# Patient Record
Sex: Female | Born: 1995 | Race: Black or African American | Hispanic: No | Marital: Single | Smoking: Never smoker
Health system: Southern US, Community
[De-identification: ages and names within clinical notes are randomized; demographics above are authoritative.]

## PROBLEM LIST (undated history)

## (undated) HISTORY — PX: FRACTURE SURGERY: SHX138

---

## 2013-04-12 ENCOUNTER — Emergency Department (HOSPITAL_COMMUNITY)
Admission: EM | Admit: 2013-04-12 | Discharge: 2013-04-12 | Disposition: A | Discharge status: Home / Self Care | Attending: Emergency Medicine | Admitting: Emergency Medicine

## 2013-04-12 ENCOUNTER — Encounter (HOSPITAL_COMMUNITY): Payer: Self-pay | Admitting: Emergency Medicine

## 2013-04-12 DIAGNOSIS — M542 Cervicalgia: Secondary | ICD-10-CM | POA: Insufficient documentation

## 2013-04-12 DIAGNOSIS — Z79899 Other long term (current) drug therapy: Secondary | ICD-10-CM | POA: Insufficient documentation

## 2013-04-12 DIAGNOSIS — R079 Chest pain, unspecified: Secondary | ICD-10-CM | POA: Insufficient documentation

## 2013-04-12 DIAGNOSIS — J029 Acute pharyngitis, unspecified: Secondary | ICD-10-CM

## 2013-04-12 DIAGNOSIS — R51 Headache: Secondary | ICD-10-CM | POA: Insufficient documentation

## 2013-04-12 MED ORDER — HYDROCODONE-HOMATROPINE 5-1.5 MG/5ML PO SYRP: 5.0000 mL | ORAL_SOLUTION | Freq: Four times a day (QID) | ORAL | Status: DC | PRN

## 2013-04-12 NOTE — ED Provider Notes (Signed)
CSN: [id number]     Arrival date & time 04/12/13  1053 History  This chart was scribed for non-physician practitioner Fayrene Helper, PA-C working with Laray Anger, DO by Leone Payor, ED Scribe. This patient was seen in room TR05C/TR05C and the patient's care was started at 1053.    Chief Complaint  Patient presents with  . Sore Throat    The history is provided by the patient. No language interpreter was used.    HPI Comments: Tammy Sawyer is a 17 y.o. female who presents to the Emergency Department complaining of 1 day of gradual onset, gradually worsening, constant sore throat and anterior neck pain. Pt reports having a mild HA yesterday and intermittent, sharp chest pain that began today. Pt states her throat pain is worse with swallowing. She has tried to use Catering manager without relief. She denies history of recurrent strep throat. She denies rhinorrhea, sneezing, coughing, fever, chills, SOB, abdominal pain, nausea, vomiting, diarrhea, rash.    History reviewed. No pertinent past medical history. History reviewed. No pertinent past surgical history. No family history on file. History  Substance Use Topics  . Smoking status: Never Smoker   . Smokeless tobacco: Not on file  . Alcohol Use: No   OB History   Grav Para Term Preterm Abortions TAB SAB Ect Mult Living                 Review of Systems  Constitutional: Negative for fever and chills.  HENT: Positive for sore throat. Negative for rhinorrhea and sneezing.   Eyes: Negative for photophobia.  Respiratory: Negative for cough and shortness of breath.   Cardiovascular: Positive for chest pain.  Gastrointestinal: Negative for nausea, vomiting, abdominal pain and diarrhea.  Skin: Negative for rash.  Neurological: Positive for headaches.    Allergies  Review of patient's allergies indicates no known allergies.  Home Medications   Current Outpatient Rx  Name  Route  Sig  Dispense  Refill  . Ascorbic Acid (VITAMIN  C GUMMIE PO)   Oral   Take 2 tablets by mouth daily.         Marland Kitchen Phenyleph-CPM-DM-Aspirin (ALKA-SELTZER PLUS COLD & COUGH) 7.12-26-08-325 MG TBEF   Oral   Take 2 tablets by mouth once.          BP 119/78  Pulse 101  Temp(Src) 97 F (36.1 C) (Oral)  SpO2 98%  LMP 04/02/2013 Physical Exam  Nursing note and vitals reviewed. Constitutional: She is oriented to person, place, and time. She appears well-developed and well-nourished.  Does not appear toxic.   HENT:  Head: Normocephalic and atraumatic.  Right Ear: Tympanic membrane, external ear and ear canal normal.  Left Ear: Tympanic membrane, external ear and ear canal normal.  Nose: Nose normal.  Mouth/Throat: Uvula is midline, oropharynx is clear and moist and mucous membranes are normal. No trismus in the jaw. No oropharyngeal exudate, posterior oropharyngeal edema, posterior oropharyngeal erythema or tonsillar abscesses.  No tonsillar exudate or swelling. No evidence of deep tissue infection.   Neck: Neck supple. No rigidity.  No nuchal rigidity.   Cardiovascular: Normal rate, regular rhythm and normal heart sounds.   Pulmonary/Chest: Breath sounds normal. No respiratory distress. She has no wheezes. She has no rales. She exhibits no tenderness.  Abdominal: Soft. She exhibits no distension. There is no splenomegaly. There is no tenderness.  Neurological: She is alert and oriented to person, place, and time.  Skin: Skin is warm and dry.  Psychiatric: She  has a normal mood and affect.    ED Course  Procedures   DIAGNOSTIC STUDIES: Oxygen Saturation is 98% on RA, normal by my interpretation.    COORDINATION OF CARE: 11:56 AM Discussed negative strep results with patient.  Advised pt use saltwater gargles. Pt was given return precautions. Will prescribe pain medication for discomfort. Discussed treatment plan with pt at bedside and pt agreed to plan.  At this time, doubt deep tissue infection.  Throat culture sent.     Labs  Review Labs Reviewed  RAPID STREP SCREEN  CULTURE, GROUP A STREP   Imaging Review No results found.  EKG Interpretation   None       MDM   1. Pharyngitis    BP 119/78  Pulse 101  Temp(Src) 97 F (36.1 C) (Oral)  SpO2 98%  LMP 04/02/2013  I personally performed the services described in this documentation, which was scribed in my presence. The recorded information has been reviewed and is accurate.    Fayrene Helper, PA-C 04/12/13 1539

## 2013-04-12 NOTE — ED Notes (Signed)
Started with sore throat yesterday. States also has body aches. Denies cough.

## 2013-04-12 NOTE — ED Provider Notes (Signed)
Medical screening examination/treatment/procedure(s) were performed by non-physician practitioner and as supervising physician I was immediately available for consultation/collaboration.  EKG Interpretation   None         Laray Anger, DO 04/12/13 1712

## 2013-04-12 NOTE — Discharge Instructions (Signed)

## 2013-04-14 LAB — CULTURE, GROUP A STREP

## 2013-09-19 ENCOUNTER — Encounter (HOSPITAL_COMMUNITY): Payer: Self-pay | Admitting: Emergency Medicine

## 2013-09-19 ENCOUNTER — Emergency Department (HOSPITAL_COMMUNITY)
Admission: EM | Admit: 2013-09-19 | Discharge: 2013-09-19 | Disposition: A | Discharge status: Home / Self Care | Attending: Emergency Medicine | Admitting: Emergency Medicine

## 2013-09-19 ENCOUNTER — Emergency Department (HOSPITAL_COMMUNITY)

## 2013-09-19 DIAGNOSIS — Z3202 Encounter for pregnancy test, result negative: Secondary | ICD-10-CM | POA: Insufficient documentation

## 2013-09-19 DIAGNOSIS — B9689 Other specified bacterial agents as the cause of diseases classified elsewhere: Secondary | ICD-10-CM | POA: Insufficient documentation

## 2013-09-19 DIAGNOSIS — Z79899 Other long term (current) drug therapy: Secondary | ICD-10-CM | POA: Insufficient documentation

## 2013-09-19 DIAGNOSIS — N760 Acute vaginitis: Secondary | ICD-10-CM | POA: Insufficient documentation

## 2013-09-19 DIAGNOSIS — A499 Bacterial infection, unspecified: Secondary | ICD-10-CM | POA: Insufficient documentation

## 2013-09-19 LAB — URINALYSIS, ROUTINE W REFLEX MICROSCOPIC
Bilirubin Urine: NEGATIVE
Glucose, UA: NEGATIVE mg/dL
Ketones, ur: >80 mg/dL — AB
Nitrite: NEGATIVE
Protein, ur: 100 mg/dL — AB
Specific Gravity, Urine: 1.019 (ref 1.005–1.030)
Urobilinogen, UA: 0.2 mg/dL (ref 0.0–1.0)
pH: 7.5 (ref 5.0–8.0)

## 2013-09-19 LAB — COMPREHENSIVE METABOLIC PANEL WITH GFR
Albumin: 3.7 g/dL (ref 3.5–5.2)
BUN: 9 mg/dL (ref 6–23)
Calcium: 9.5 mg/dL (ref 8.4–10.5)
Creatinine, Ser: 0.89 mg/dL (ref 0.50–1.10)
Glucose, Bld: 74 mg/dL (ref 70–99)
Total Protein: 8.4 g/dL — ABNORMAL HIGH (ref 6.0–8.3)

## 2013-09-19 LAB — CBC WITH DIFFERENTIAL/PLATELET
Basophils Absolute: 0 10*3/uL (ref 0.0–0.1)
Basophils Relative: 0 % (ref 0–1)
Eosinophils Absolute: 0.1 K/uL (ref 0.0–0.7)
Eosinophils Relative: 1 % (ref 0–5)
HCT: 38.7 % (ref 36.0–46.0)
Hemoglobin: 12.9 g/dL (ref 12.0–15.0)
Lymphocytes Relative: 12 % (ref 12–46)
Lymphs Abs: 1.6 K/uL (ref 0.7–4.0)
MCH: 28.4 pg (ref 26.0–34.0)
MCHC: 33.3 g/dL (ref 30.0–36.0)
MCV: 85.1 fL (ref 78.0–100.0)
Monocytes Absolute: 2 K/uL — ABNORMAL HIGH (ref 0.1–1.0)
Monocytes Relative: 14 % — ABNORMAL HIGH (ref 3–12)
Neutro Abs: 10 10*3/uL — ABNORMAL HIGH (ref 1.7–7.7)
Neutrophils Relative %: 73 % (ref 43–77)
Platelets: 305 10*3/uL (ref 150–400)
RBC: 4.55 MIL/uL (ref 3.87–5.11)
RDW: 14.1 % (ref 11.5–15.5)
WBC: 13.6 10*3/uL — ABNORMAL HIGH (ref 4.0–10.5)

## 2013-09-19 LAB — URINE MICROSCOPIC-ADD ON

## 2013-09-19 LAB — COMPREHENSIVE METABOLIC PANEL
ALT: 7 U/L (ref 0–35)
AST: 15 U/L (ref 0–37)
Alkaline Phosphatase: 53 U/L (ref 39–117)
CO2: 21 mEq/L (ref 19–32)
Chloride: 102 mEq/L (ref 96–112)
GFR calc Af Amer: >90 mL/min (ref >90)
GFR calc non Af Amer: >90 mL/min (ref >90)
Potassium: 4.1 mEq/L (ref 3.7–5.3)
Sodium: 139 mEq/L (ref 137–147)
Total Bilirubin: 0.4 mg/dL (ref 0.3–1.2)

## 2013-09-19 LAB — WET PREP, GENITAL
Trich, Wet Prep: NONE SEEN
YEAST WET PREP: NONE SEEN

## 2013-09-19 LAB — POC URINE PREG, ED: Preg Test, Ur: NEGATIVE

## 2013-09-19 MED ORDER — SODIUM CHLORIDE 0.9 % IV BOLUS (SEPSIS)
1000.0000 mL | Freq: Once | INTRAVENOUS | Status: AC
  Administered 2013-09-19: 1000 mL via INTRAVENOUS

## 2013-09-19 MED ORDER — METRONIDAZOLE 500 MG PO TABS: 500.0000 mg | ORAL_TABLET | Freq: Two times a day (BID) | ORAL | Status: DC

## 2013-09-19 MED ORDER — ONDANSETRON HCL 4 MG/2ML IJ SOLN
4.0000 mg | Freq: Once | INTRAMUSCULAR | Status: AC
  Administered 2013-09-19: 4 mg via INTRAVENOUS
  Filled 2013-09-19: qty 2

## 2013-09-19 MED ORDER — MORPHINE SULFATE 4 MG/ML IJ SOLN
4.0000 mg | Freq: Once | INTRAMUSCULAR | Status: AC
Start: 2013-09-19 — End: 2013-09-19
  Administered 2013-09-19: 4 mg via INTRAVENOUS
  Filled 2013-09-19: qty 1

## 2013-09-19 NOTE — Discharge Instructions (Signed)
Abdominal (belly) pain can be caused by many things. Your caregiver performed an examination and possibly ordered blood/urine tests and imaging (CT scan, x-rays, ultrasound). Many cases can be observed and treated at home after initial evaluation in the emergency department. Even though you are being discharged home, abdominal pain can be unpredictable. Therefore, you need a repeated exam if your pain does not resolve, returns, or worsens. Most patients with abdominal pain don't have to be admitted to the hospital or have surgery, but serious problems like appendicitis and gallbladder attacks can start out as nonspecific pain. Many abdominal conditions cannot be diagnosed in one visit, so follow-up evaluations are very important. °SEEK IMMEDIATE MEDICAL ATTENTION IF: °The pain does not go away or becomes severe.  °A temperature above 101 develops.  °Repeated vomiting occurs (multiple episodes).  °The pain becomes localized to portions of the abdomen. The right side could possibly be appendicitis. In an adult, the left lower portion of the abdomen could be colitis or diverticulitis.  °Blood is being passed in stools or vomit (bright red or black tarry stools).  °Return also if you develop chest pain, difficulty breathing, dizziness or fainting, or become confused, poorly responsive, or inconsolable (young children). ° ° ° ° ° ° °  Emergency Department Resource Guide °1) Find a Doctor and Pay Out of Pocket °Although you won't have to find out who is covered by your insurance plan, it is a good idea to ask around and get recommendations. You will then need to call the office and see if the doctor you have chosen will accept you as a new patient and what types of options they offer for patients who are self-pay. Some doctors offer discounts or will set up payment plans for their patients who do not have insurance, but you will need to ask so you aren't surprised when you get to your appointment. ° °2) Contact Your Local  Health Department °Not all health departments have doctors that can see patients for sick visits, but many do, so it is worth a call to see if yours does. If you don't know where your local health department is, you can check in your phone book. The CDC also has a tool to help you locate your state's health department, and many state websites also have listings of all of their local health departments. ° °3) Find a Walk-in Clinic °If your illness is not likely to be very severe or complicated, you may want to try a walk in clinic. These are popping up all over the country in pharmacies, drugstores, and shopping centers. They're usually staffed by nurse practitioners or physician assistants that have been trained to treat common illnesses and complaints. They're usually fairly quick and inexpensive. However, if you have serious medical issues or chronic medical problems, these are probably not your best option. ° °No Primary Care Doctor: °- Call [facility] at  832-8000 - they can help you locate a primary care doctor that  accepts your insurance, provides certain services, etc. °- Physician Referral Service- 1-800-533-3463 ° °Chronic Pain Problems: °Organization         Address  Phone   Notes  ° [facility]  (336) 297-2271 Patients need to be referred by their primary care doctor.  ° °Medication Assistance: °Organization         Address  Phone   Notes  °[place] [facility] [address] °[place], [place] [address] (336) 641-8030 --Must be a   resident of [place] °-- Must have NO insurance coverage whatsoever (no Medicaid/ Medicare, etc.) °-- The pt. MUST have a primary care doctor that directs their care regularly and follows them in the community °  °[facility]  (866) 331-1348   °[employer]  (888) 892-1162   ° °Agencies that provide inexpensive medical care: °Organization         Address  Phone   Notes  °[facility]  (336) 832-8035    °[facility]    (336) 832-7272   °[facility] [address] °[place], [place] [address] (336) 832-4777   °[facility] [address], °[place] (336) 271-4999   °[facility]    (336) 373-0678   °[facility]    (336) 272-1050   °[facility] ° [address], [place] Phone:  (336) 832-4444, Fax:  (336) 832-4440 Hours of Operation:  9 am - 6 pm, M-F.  Also accepts Medicaid/Medicare and self-pay.  °[facility] ° [address], [place] Phone: (336) 832-3150, Fax: (336) 832-3151. Hours of Operation:  8:30 am - 5:30 pm, M-F.  Also accepts Medicaid and self-pay.  °[facility] [place] [address], [place] Phone: (336) 878-6027   °[facility] [address], [place], [place] (336)723-1848, Ext. 123 Mondays & Thursdays: 7-9 AM.  First 15 patients are seen on a first come, first serve basis. °  ° °Medicaid-accepting [place] Providers: ° °Organization         Address  Phone   Notes  °[facility] [address], [place] (336) 641-2100 Also accepts self-pay patients.  °[facility] [address], [place] ° (336) 856-9996   °[facility] [address], [place] (336) 288-8857   °[facility] [address] [place] [address], [place] (336) 299-7000   °Veita Bland [address], [place]  ° (336) 373-1557 Only accepts [place] Medicaid patients after they have their name applied to their card.  ° °Self-Pay (no insurance) in [place]: ° °Organization         Address  Phone   Notes  °Sickle Cell Patients, [facility] [address], [place] (336) 832-1970   °[facility] [address], [place] (336) 832-4400   °[facility] [place] ° [address],  [place] (336) 992-4800   °[facility]/Dr. Osei-Bonsu ° [address] [place] [address], [place] or [address], [place] (336) 841-8500 Phone number for both [place] and [place] locations is the same.  °[facility] [address], [place] (336) 299-0000   °[facility] [address] [place] [address], [place] or [address] (336) 852-7530 °(336) 878-2260   °[facility] [address],  (336) 350-1642, phone; (336) 294-5005, fax Sees patients 1st and 3rd Saturday of every month.  Must not qualify for public or private insurance (i.e. Medicaid, Medicare, [place] Health Choice, Veterans' Benefits) • Household income should be no more than 200% of the poverty level •The clinic cannot treat you if you are pregnant or think you are pregnant • Sexually transmitted diseases are not treated at the clinic.  ° °Dental Care: °Organization         Address    Phone  Notes  °[facility] [address], [place] (336) 641-6152 Accepts children up to age 21 who are enrolled in Medicaid or [place] Health Choice; pregnant women with a Medicaid card; and children who have applied for Medicaid or [place] Health Choice, but were declined, whose parents can pay a reduced fee at time of service.  °[facility] [place]  [address], [place] (336) 641-7733 Accepts children up to age 21 who are enrolled in Medicaid or [place] Health Choice; pregnant women with a Medicaid card; and children who have applied for Medicaid or [place] Health Choice, but were declined, whose parents can pay a reduced fee at time of service.  °[facility] ° [address], [place] (336) 641-4533 Patients are seen by appointment only. Walk-ins are not accepted. [facility] will see patients 18 years of age and older. °Monday - Tuesday (8am-5pm) °Most Wednesdays  (8:30-5pm) °$30 per visit, cash only  °[facility] ° [address], [place] (336) 641-4533 Patients are seen by appointment only. Walk-ins are not accepted. [facility] will see patients 18 years of age and older. °One Wednesday Evening (Monthly: Volunteer Based).  $30 per visit, cash only  °[facility]  (919) 537-3737 for adults; Children under age 4, call Graduate Pediatric Dentistry at (919) [phone]. Children aged 4-14, please call (919) [phone] to request a pediatric application. ° Dental services are provided in all areas of dental care including fillings, crowns and bridges, complete and partial dentures, implants, gum treatment, root canals, and extractions. Preventive care is also provided. Treatment is provided to both adults and children. °Patients are selected via a lottery and there is often a waiting list. °  °[facility] [address], °[place] ° (336) 763-8833 [email or web address] °  °[facility] [address], [place], [place] (336)723-1848, Ext. 123 Second and Fourth Thursday of each month, opens at 6:30 AM; Clinic ends at 9 AM.  Patients are seen on a first-come first-served basis, and a limited number are seen during each clinic.  ° °[facility] ° [address], [place], [place] (336) 723-7904   Eligibility Requirements °You must have lived in [place], [place], or [place] counties for at least the last three months. °  You cannot be eligible for state or federal sponsored healthcare insurance, including Veterans Administration, Medicaid, or Medicare. °  You generally cannot be eligible for healthcare insurance through your employer.  °  How to apply: °Eligibility screenings are held every Tuesday and Wednesday afternoon from 1:00 pm until 4:00 pm. You do not need an appointment for the interview!  °[facility] [address], [place], [place] 336-631-2330   °[place]  [facility]  336-342-8273   °[place]  336-703-3100   °[facility]  336-570-6415   ° °Behavioral Health Resources in the Community: °Intensive Outpatient Programs °Organization         Address  Phone  Notes  °[facility] [address], [place], [place] 336-878-6098   °[facility] [address], [place], [place] 336-832-9800   °[facility] [address], [place], [place] ° 336-882-2125   °[facility] [address],  °[place], [place] 1-800-853-5163 or 336-641-4981   °Substance Abuse Resources °Organization           Address  Phone  Notes  °[facility]  336-882-2125   °[facility]  336-784-9470   °[facility]  336-285-9073   °[facility]  336-845-3988   °Residential & Outpatient Substance Abuse Program  1-800-659-3381   °Psychological Services °Organization         Address  Phone  Notes  °[facility]  336- 832-9600   °[facility]  336- 378-7881   °[facility] [address], [place] 1-800-853-5163 or 336-641-4981   ° °Mobile Crisis Teams °Organization         Address  Phone  Notes  °[facility], Mobile Crisis Care Unit  1-877-626-1772   °Assertive °[facility] ° [address] [place], [place] 336-834-9664   °Sharon DeEsch [address] °[place] 336-554-5454   ° °Self-Help/Support Groups °Organization         Address  Phone             Notes  °[facility] [place] - variety of support groups  336- 373-1402 Call for more information  °Narcotics Anonymous (NA), [facility] [address], °[place]  2 meetings at this location  ° °Residential Treatment Programs °Organization         Address  Phone  Notes  °[facility] [address],    °[place]  1-866-801-8205   °[facility] ° [address], [place], [place]  704-293-8524   °[facility] [address], [place] 336-845-3988 Admissions: 8am-3pm M-F  °[facility] [address]    °[place], [place] 336-841-1104   °[facility] [address], [place], [place] 336-379-7146   °[facility] [address]  °[place], [place] 336-285-9073   °[facility] Intensive Outpatient [address], [place], [place] 336-852-3033   °[facility] [address]  °[place], [place] 1-877-615-2722 or 336-784-9470   °[facility] [address] [place], [place] 336-227-7417 Accepts Medicaid  °[facility] [address]  ° [place] 1-800-659-3381 Substance Abuse/Addiction Treatment  ° °[place] Behavioral Health Resources °Organization         Address  Phone  Notes  °[facility]  (888) 581-9988   °Julie Brannon, PhD [address] [place], [place]   (336) 349-5553 or (336) 951-0000   °[facility]   [address] °[place], [place] (336) 349-4454   °[facility] [address], [place], [place] (336) 342-8316 Insurance/Medicaid/sponsorship through [facility]  °[facility] [address]                                    [place], [place] (336) 342-8316 Therapy/tele-psych/case  °[facility] [address]  ° [place], [place] (336) 349-2233    °Dr. Arfeen  (336) 349-4544   °[facility] [place]  [employer] [place] [facility] 1) [address],  °2) [address], [place] °3)  [address], [place] (336) 349-3220 °(336) 342-7768 ° °(336) 342-8140   °[place] [facility] (336) 342-1394 or (336) 342-3537 (After Hours)    ° °   °

## 2013-09-19 NOTE — ED Notes (Signed)
Patient transported to CT 

## 2013-09-19 NOTE — ED Provider Notes (Signed)
CSN: [id number]     Arrival date & time 09/19/13  1623 History   First MD Initiated Contact with Patient 09/19/13 1916     Chief Complaint  Patient presents with  . Back Pain  . Abdominal Pain     (Consider location/radiation/quality/duration/timing/severity/associated sxs/prior Treatment) The history is provided by the patient.   history of present illness: 18 year old female who presents with chief complaint of back pain and abdominal pain. Onset of symptoms was 2 days ago. Patient reports pain in bilateral flanks left greater than right wrapping around to the lower abdomen. Pain has been constant. Currently rated 8/10. She has had associated urinary frequency and a white vaginal discharge. She denies any fevers, nausea, vomiting, constipation, or diarrhea. No prior history of renal stone or urinary tract infection  History reviewed. No pertinent past medical history. History reviewed. No pertinent past surgical history. No family history on file. History  Substance Use Topics  . Smoking status: Never Smoker   . Smokeless tobacco: Not on file  . Alcohol Use: No   OB History   Grav Para Term Preterm Abortions TAB SAB Ect Mult Living                 Review of Systems  Constitutional: Negative for fever and chills.  HENT: Negative for congestion.   Eyes: Negative for pain.  Respiratory: Negative for shortness of breath.   Cardiovascular: Negative for chest pain.  Gastrointestinal: Positive for abdominal pain. Negative for nausea, vomiting, diarrhea and constipation.  Genitourinary: Positive for flank pain and vaginal discharge. Negative for dysuria, urgency, hematuria, vaginal bleeding, difficulty urinating and vaginal pain.  Musculoskeletal: Negative for back pain.  Skin: Negative for rash and wound.  Neurological: Negative for headaches.  All other systems reviewed and are negative.     Allergies  Review of patient's allergies indicates no known allergies.  Home  Medications   Prior to Admission medications   Medication Sig Start Date End Date Taking? Authorizing Provider  norgestimate-ethinyl estradiol (MONO-LINYAH) 0.25-35 MG-MCG tablet Take 1 tablet by mouth every evening.   Yes Historical Provider, MD   BP 105/66  Pulse 83  Temp(Src) 97.6 F (36.4 C) (Oral)  Resp 16  Ht 5\' 8"  (1.727 m)  Wt 151 lb (68.493 kg)  BMI 22.96 kg/m2  SpO2 99% Physical Exam  Nursing note and vitals reviewed. Constitutional: She is oriented to person, place, and time. She appears well-developed and well-nourished. No distress.  HENT:  Head: Normocephalic and atraumatic.  Eyes: Conjunctivae are normal.  Neck: Neck supple.  Cardiovascular: Normal rate, regular rhythm, normal heart sounds and intact distal pulses.   Pulmonary/Chest: Effort normal and breath sounds normal. She has no wheezes. She has no rales.  Abdominal: Soft. She exhibits no distension. There is generalized tenderness. There is CVA tenderness (left).  Genitourinary: Cervix exhibits no motion tenderness. Right adnexum displays no mass, no tenderness and no fullness. Left adnexum displays no mass, no tenderness and no fullness. No bleeding around the vagina. Vaginal discharge (white) found.  Musculoskeletal: Normal range of motion.  Neurological: She is alert and oriented to person, place, and time.  Skin: Skin is warm and dry.    ED Course  Procedures (including critical care time) Labs Review Labs Reviewed  WET PREP, GENITAL - Abnormal; Notable for the following:    Clue Cells Wet Prep HPF POC MODERATE (*)    WBC, Wet Prep HPF POC FEW (*)    All other components within normal limits  CBC WITH DIFFERENTIAL - Abnormal; Notable for the following:    WBC 13.6 (*)    Neutro Abs 10.0 (*)    Monocytes Relative 14 (*)    Monocytes Absolute 2.0 (*)    All other components within normal limits  COMPREHENSIVE METABOLIC PANEL - Abnormal; Notable for the following:    Total Protein 8.4 (*)    All  other components within normal limits  URINALYSIS, ROUTINE W REFLEX MICROSCOPIC - Abnormal; Notable for the following:    APPearance CLOUDY (*)    Hgb urine dipstick SMALL (*)    Ketones, ur >80 (*)    Protein, ur 100 (*)    Leukocytes, UA SMALL (*)    All other components within normal limits  URINE MICROSCOPIC-ADD ON - Abnormal; Notable for the following:    Squamous Epithelial / LPF FEW (*)    Bacteria, UA MANY (*)    All other components within normal limits  GC/CHLAMYDIA PROBE AMP  POC URINE PREG, ED    Imaging Review Ct Abdomen Pelvis Wo Contrast  09/19/2013   CLINICAL DATA:  Lower abdominal pain and lower back pain. Increased urinary frequency.  EXAM: CT ABDOMEN AND PELVIS WITHOUT CONTRAST  TECHNIQUE: Multidetector CT imaging of the abdomen and pelvis was performed following the standard protocol without intravenous contrast.  COMPARISON:  None.  FINDINGS: The visualized lung bases are clear.  The liver and spleen are unremarkable in appearance. The gallbladder is within normal limits. The pancreas and adrenal glands are unremarkable.  The kidneys are unremarkable in appearance. There is no evidence of hydronephrosis. No renal or ureteral stones are seen. No perinephric stranding is appreciated.  No free fluid is identified. The small bowel is unremarkable in appearance. The stomach is within normal limits. No acute vascular abnormalities are seen.  The appendix is normal in caliber and contains air, without evidence for appendicitis. The colon is unremarkable in appearance.  The bladder is mildly distended and grossly unremarkable in appearance. The uterus is within normal limits. The ovaries are relatively symmetric, with a small likely physiologic left adnexal cyst; no suspicious adnexal masses are seen. No inguinal lymphadenopathy is seen.  No acute osseous abnormalities are identified.  IMPRESSION: Unremarkable noncontrast CT of the abdomen and pelvis.   Electronically Signed   By:  Roanna RaiderJeffery  Chang M.D.   On: 09/19/2013 22:08     EKG Interpretation None      MDM   Final diagnoses:  Bacterial vaginosis    18 year old female with no past history who presents with 2 days of bilateral flank pain wrapping around to the lower abdomen. AF VSS. Exam with generalized abdominal tenderness to palpation greatest in the lower abdomen and left CVA tenderness. Pelvic exam with white vaginal discharge. No cervical motion tenderness, adnexal tenderness, fullness, or mass. UPT negative. Doubt ectopic, TOA, torsion, PID. Patient is not concerned for sexually transmitted infection.  Patient was given IV fluids, morphine, and Zofran with significant improvement in symptoms. Labs remarkable for a leukocytosis of 13 urinalysis with small hemoglobin and small leukocytes. Patient currently denying dysuria or urinary symptoms. Wet prep with moderate clue cells consistent with BV. CT of the abdomen and pelvis was obtained that was unremarkable. No evidence of nephrolithiasis, pyelonephritis, appendicitis, diverticulitis, bowel obstruction.  Patient appropriate for discharge home. She was provided resources to establish PCP. Will give course of Flagyl for bacterial vaginosis. Return precautions given.    Cherre RobinsBryan Oluwasemilore Pascuzzi, MD 09/19/13 [phone]

## 2013-09-19 NOTE — ED Provider Notes (Signed)
I saw and evaluated the patient, reviewed the resident's note and I agree with the findings and plan.   EKG Interpretation None      Abd pain for a couple of days, radiation some to lower back, no dysuria or freq.  Pelvic exam unremarkable by Dr. Christain SacramentoBeaver.  CT of abd/pelvis shows no acute abn.  BV noted on pelvic cultures.  UA shows possibly mild cystitis.  Will put on flagyl and send urine culture.  Pt reassured, stable for discharge to home.  No current abd guard or rebound.    Gavin PoundMichael Y. Trissa Molina, MD 09/19/13 2325

## 2013-09-19 NOTE — ED Notes (Signed)
Dr. Christain SacramentoBeaver at bedside performing pelvic exam

## 2013-09-19 NOTE — ED Notes (Signed)
MD at bedside. 

## 2013-09-19 NOTE — ED Notes (Signed)
Pt returned from CT °

## 2013-09-19 NOTE — ED Notes (Signed)
Pt presents to department for evaluation of lower abdominal pain and lower back pain. Ongoing x2 days. Also states increased urinary frequency. 8/10 pain at the time. Pt is alert and oriented x4.

## 2013-09-19 NOTE — ED Notes (Signed)
Pelvic cart set up at the bedside.  

## 2013-09-20 LAB — GC/CHLAMYDIA PROBE AMP
CT Probe RNA: NEGATIVE
GC PROBE AMP APTIMA: NEGATIVE

## 2013-09-22 ENCOUNTER — Encounter (HOSPITAL_COMMUNITY): Payer: Self-pay | Admitting: Emergency Medicine

## 2013-09-22 ENCOUNTER — Emergency Department (HOSPITAL_COMMUNITY)
Admission: EM | Admit: 2013-09-22 | Discharge: 2013-09-22 | Disposition: A | Discharge status: Home / Self Care | Attending: Emergency Medicine | Admitting: Emergency Medicine

## 2013-09-22 DIAGNOSIS — N12 Tubulo-interstitial nephritis, not specified as acute or chronic: Secondary | ICD-10-CM | POA: Insufficient documentation

## 2013-09-22 DIAGNOSIS — Z3202 Encounter for pregnancy test, result negative: Secondary | ICD-10-CM | POA: Insufficient documentation

## 2013-09-22 DIAGNOSIS — R630 Anorexia: Secondary | ICD-10-CM | POA: Insufficient documentation

## 2013-09-22 DIAGNOSIS — Z792 Long term (current) use of antibiotics: Secondary | ICD-10-CM | POA: Insufficient documentation

## 2013-09-22 DIAGNOSIS — R079 Chest pain, unspecified: Secondary | ICD-10-CM | POA: Insufficient documentation

## 2013-09-22 DIAGNOSIS — Z79899 Other long term (current) drug therapy: Secondary | ICD-10-CM | POA: Insufficient documentation

## 2013-09-22 DIAGNOSIS — M549 Dorsalgia, unspecified: Secondary | ICD-10-CM

## 2013-09-22 LAB — BASIC METABOLIC PANEL
BUN: 7 mg/dL (ref 6–23)
CHLORIDE: 103 meq/L (ref 96–112)
CO2: 23 mEq/L (ref 19–32)
Calcium: 9 mg/dL (ref 8.4–10.5)
Creatinine, Ser: 0.86 mg/dL (ref 0.50–1.10)
Glucose, Bld: 85 mg/dL (ref 70–99)
POTASSIUM: 3.9 meq/L (ref 3.7–5.3)
Sodium: 140 mEq/L (ref 137–147)

## 2013-09-22 LAB — CBC
HCT: 34.4 % — ABNORMAL LOW (ref 36.0–46.0)
HEMOGLOBIN: 11.5 g/dL — AB (ref 12.0–15.0)
MCH: 28.2 pg (ref 26.0–34.0)
MCHC: 33.4 g/dL (ref 30.0–36.0)
MCV: 84.3 fL (ref 78.0–100.0)
Platelets: 280 10*3/uL (ref 150–400)
RBC: 4.08 MIL/uL (ref 3.87–5.11)
RDW: 13.8 % (ref 11.5–15.5)
WBC: 7.2 10*3/uL (ref 4.0–10.5)

## 2013-09-22 LAB — URINE MICROSCOPIC-ADD ON

## 2013-09-22 LAB — URINALYSIS, ROUTINE W REFLEX MICROSCOPIC
BILIRUBIN URINE: NEGATIVE
GLUCOSE, UA: NEGATIVE mg/dL
Ketones, ur: >80 mg/dL — AB
Nitrite: NEGATIVE
Protein, ur: 30 mg/dL — AB
SPECIFIC GRAVITY, URINE: 1.019 (ref 1.005–1.030)
UROBILINOGEN UA: 1 mg/dL (ref 0.0–1.0)
pH: 6.5 (ref 5.0–8.0)

## 2013-09-22 LAB — POC URINE PREG, ED: PREG TEST UR: NEGATIVE

## 2013-09-22 LAB — I-STAT TROPONIN, ED: TROPONIN I, POC: 0 ng/mL (ref 0.00–0.08)

## 2013-09-22 MED ORDER — LEVOFLOXACIN 750 MG PO TABS: 750.0000 mg | ORAL_TABLET | Freq: Every day | ORAL | Status: DC

## 2013-09-22 MED ORDER — ONDANSETRON 4 MG PO TBDP: ORAL_TABLET | ORAL | Status: AC

## 2013-09-22 MED ORDER — LEVOFLOXACIN 750 MG PO TABS
750.0000 mg | ORAL_TABLET | Freq: Once | ORAL | Status: AC
  Administered 2013-09-22: 750 mg via ORAL
  Filled 2013-09-22: qty 1

## 2013-09-22 NOTE — Discharge Instructions (Signed)
Take antibiotics as directed and stay well-hydrated. Return for persistent fevers, vomiting or worsening symptoms.  If you were given medicines take as directed.  If you are on coumadin or contraceptives realize their levels and effectiveness is altered by many different medicines.  If you have any reaction (rash, tongues swelling, other) to the medicines stop taking and see a physician.   Please follow up as directed and return to the ER or see a physician for new or worsening symptoms.  Thank you. Filed Vitals:   09/22/13 0115  BP: 124/76  Pulse: 89  Temp: 98.9 F (37.2 C)  TempSrc: Oral  Resp: 18  Height: 5\' 8"  (1.727 m)  Weight: 151 lb (68.493 kg)  SpO2: 98%

## 2013-09-22 NOTE — ED Notes (Signed)
Pt. reports upper chest pain onset today  , low abdominal pain and and low back pain for several days . Slight nausea and loose stools with chills. Respirations unlabored .

## 2013-09-22 NOTE — ED Provider Notes (Signed)
CSN: [id number]     Arrival date & time 09/22/13  0104 History   First MD Initiated Contact with Patient 09/22/13 [phone]     Chief Complaint  Patient presents with  . Chest Pain  . Abdominal Pain  . Back Pain     (Consider location/radiation/quality/duration/timing/severity/associated sxs/prior Treatment) HPI Comments: -year-old female with no significant medical history, no STD history, no pregnancy history presents with worsening pelvic discomfort that has now spread to her back with mild nausea. Patient was recently seen in the ER and diagnosed with possible BV. No fevers. No current antibiotics. Denies urinary symptoms. No abdominal surgery history. Clarified chest pain with patient after reading nurses note. Patient has had intermittent anterior chest ache bilateral nonradiating for the past several weeks. No exertional component or cardiac history.  Patient is a 18 y.o. female presenting with chest pain, abdominal pain, and back pain. The history is provided by the patient.  Chest Pain Associated symptoms: abdominal pain and back pain   Associated symptoms: no fever, no headache, no shortness of breath and not vomiting   Abdominal Pain Associated symptoms: chest pain (anterior epigastric, nonradiating)   Associated symptoms: no chills, no dysuria, no fever, no shortness of breath and no vomiting   Back Pain Associated symptoms: abdominal pain and chest pain (anterior epigastric, nonradiating)   Associated symptoms: no dysuria, no fever and no headaches     History reviewed. No pertinent past medical history. History reviewed. No pertinent past surgical history. No family history on file. History  Substance Use Topics  . Smoking status: Never Smoker   . Smokeless tobacco: Not on file  . Alcohol Use: No   OB History   Grav Para Term Preterm Abortions TAB SAB Ect Mult Living                 Review of Systems  Constitutional: Positive for appetite change. Negative for fever  and chills.  HENT: Negative for congestion.   Eyes: Negative for visual disturbance.  Respiratory: Negative for shortness of breath.   Cardiovascular: Positive for chest pain (anterior epigastric, nonradiating).  Gastrointestinal: Positive for abdominal pain. Negative for vomiting.  Genitourinary: Negative for dysuria and flank pain.  Musculoskeletal: Positive for back pain. Negative for neck pain and neck stiffness.  Skin: Negative for rash.  Neurological: Negative for light-headedness and headaches.      Allergies  Review of patient's allergies indicates no known allergies.  Home Medications   Prior to Admission medications   Medication Sig Start Date End Date Taking? Authorizing Provider  metroNIDAZOLE (FLAGYL) 500 MG tablet Take 1 tablet (500 mg total) by mouth 2 (two) times daily. 09/19/13  Yes Cherre RobinsBryan Beaver, MD  norgestimate-ethinyl estradiol [facility](MONO-LINYAH) 0.25-35 MG-MCG tablet Take 1 tablet by mouth every evening.   Yes Historical Provider, MD   BP 124/76  Pulse 89  Temp(Src) 98.9 F (37.2 C) (Oral)  Resp 18  Ht 5\' 8"  (1.727 m)  Wt 151 lb (68.493 kg)  BMI 22.96 kg/m2  SpO2 98%  LMP 08/23/2013 Physical Exam  Nursing note and vitals reviewed. Constitutional: She is oriented to person, place, and time. She appears well-developed and well-nourished.  HENT:  Head: Normocephalic and atraumatic.  Mild dry mucous membranes  Eyes: Conjunctivae are normal. Right eye exhibits no discharge. Left eye exhibits no discharge.  Neck: Normal range of motion. Neck supple. No tracheal deviation present.  Cardiovascular: Normal rate and regular rhythm.   Pulmonary/Chest: Effort normal and breath sounds normal.  Abdominal:  Soft. She exhibits no distension. There is tenderness (mild suprapubic). There is no guarding.  Musculoskeletal: She exhibits tenderness (mild flankright flank). She exhibits no edema.  Neurological: She is alert and oriented to person, place, and time.  Skin: Skin is  warm. No rash noted.  Psychiatric: She has a normal mood and affect.    ED Course  Procedures (including critical care time) Labs Review Labs Reviewed  CBC - Abnormal; Notable for the following:    Hemoglobin 11.5 (*)    HCT 34.4 (*)    All other components within normal limits  URINALYSIS, ROUTINE W REFLEX MICROSCOPIC - Abnormal; Notable for the following:    APPearance CLOUDY (*)    Hgb urine dipstick SMALL (*)    Ketones, ur >80 (*)    Protein, ur 30 (*)    Leukocytes, UA MODERATE (*)    All other components within normal limits  URINE MICROSCOPIC-ADD ON - Abnormal; Notable for the following:    Squamous Epithelial / LPF MANY (*)    Bacteria, UA MANY (*)    All other components within normal limits  BASIC METABOLIC PANEL  POC URINE PREG, ED  I-STAT TROPOININ, ED    Imaging Review No results found.  Date/Time:  Wednesday September 22 2013 01:09:50 EDT Ventricular Rate:  108 PR Interval:  116 QRS Duration: 76 QT Interval:  318 QTC Calculation: 426 R Axis:   116 Text Interpretation:  Suspect arm lead reversal, interpretation  assumes no reversal Sinus tachycardia Biatrial enlargement Lateral infarct  , age undetermined Abnormal ECG Confirmed by Itzia Cunliffe  MD, Anita Mcadory ([id number]) on  09/22/2013 3:07:21 AM     MDM   Final diagnoses:  Pyelonephritis  Back pain   Clinically patient with UTI/pyelonephritis with suprapubic pain, back pain, nausea and urine infected. Chest pain very atypical and patient has no risk factors. I am not concerned for ACS or PE at this time. EKG reviewed, mild tachycardia. Patient is not on any pain meds at this time. Followup with OB/GYN discussed. Levaquin given in the ED. reviewed recent GC culture was negative. Patient will followup with women's clinic if no improvement. Discussed repeat GU exam however no changes since done patient has no vaginal discharge or bleeding. Patient wishes to have this done outpatient no improvement.  Results and  differential diagnosis were discussed with the patient. Close follow up outpatient was discussed, patient comfortable with the plan.   Filed Vitals:   09/22/13 0115  BP: 124/76  Pulse: 89  Temp: 98.9 F (37.2 C)  TempSrc: Oral  Resp: 18  Height: 5\' 8"  (1.727 m)  Weight: 151 lb (68.493 kg)  SpO2: 98%       Enid SkeensJoshua M Amara Justen, MD 09/22/13 [phone]

## 2014-12-11 ENCOUNTER — Encounter (HOSPITAL_COMMUNITY): Payer: Self-pay | Admitting: *Deleted

## 2014-12-11 ENCOUNTER — Emergency Department (HOSPITAL_COMMUNITY)
Admission: EM | Admit: 2014-12-11 | Discharge: 2014-12-11 | Disposition: A | Discharge status: Home / Self Care | Attending: Emergency Medicine | Admitting: Emergency Medicine

## 2014-12-11 DIAGNOSIS — R109 Unspecified abdominal pain: Secondary | ICD-10-CM | POA: Diagnosis present

## 2014-12-11 DIAGNOSIS — Z79899 Other long term (current) drug therapy: Secondary | ICD-10-CM | POA: Insufficient documentation

## 2014-12-11 DIAGNOSIS — I889 Nonspecific lymphadenitis, unspecified: Secondary | ICD-10-CM | POA: Insufficient documentation

## 2014-12-11 MED ORDER — AZITHROMYCIN 250 MG PO TABS: 250.0000 mg | ORAL_TABLET | Freq: Every day | ORAL | Status: DC

## 2014-12-11 NOTE — ED Notes (Signed)
Reports several small abscess to right groin. Denies recent fever. No acute distress noted at triage.

## 2014-12-11 NOTE — Discharge Instructions (Signed)
Lymphadenopathy Return for fever or worsening symptoms. [facility]. Take in a biotics as prescribed. Lymphadenopathy means "disease of the lymph glands." But the term is usually used to describe swollen or enlarged lymph glands, also called lymph nodes. These are the bean-shaped organs found in many locations including the neck, underarm, and groin. Lymph glands are part of the immune system, which fights infections in your body. Lymphadenopathy can occur in just one area of the body, such as the neck, or it can be generalized, with lymph node enlargement in several areas. The nodes found in the neck are the most common sites of lymphadenopathy. CAUSES When your immune system responds to germs (such as viruses or bacteria ), infection-fighting cells and fluid build up. This causes the glands to grow in size. Usually, this is not something to worry about. Sometimes, the glands themselves can become infected and inflamed. This is called lymphadenitis. Enlarged lymph nodes can be caused by many diseases:  Bacterial disease, such as strep throat or a skin infection.  Viral disease, such as a common cold.  Other germs, such as Lyme disease, tuberculosis, or sexually transmitted diseases.  Cancers, such as lymphoma (cancer of the lymphatic system) or leukemia (cancer of the white blood cells).  Inflammatory diseases such as lupus or rheumatoid arthritis.  Reactions to medications. Many of the diseases above are rare, but important. This is why you should see your caregiver if you have lymphadenopathy. SYMPTOMS  Swollen, enlarged lumps in the neck, back of the head, or other locations.  Tenderness.  Warmth or redness of the skin over the lymph nodes.  Fever. DIAGNOSIS Enlarged lymph nodes are often near the source of infection. They can help health care providers diagnose your illness. For instance:  Swollen lymph nodes around the jaw might be caused by an  infection in the mouth.  Enlarged glands in the neck often signal a throat infection.  Lymph nodes that are swollen in more than one area often indicate an illness caused by a virus. Your caregiver will likely know what is causing your lymphadenopathy after listening to your history and examining you. Blood tests, x-rays, or other tests may be needed. If the cause of the enlarged lymph node cannot be found, and it does not go away by itself, then a biopsy may be needed. Your caregiver will discuss this with you. TREATMENT Treatment for your enlarged lymph nodes will depend on the cause. Many times the nodes will shrink to normal size by themselves, with no treatment. Antibiotics or other medicines may be needed for infection. Only take over-the-counter or prescription medicines for pain, discomfort, or fever as directed by your caregiver. HOME CARE INSTRUCTIONS Swollen lymph glands usually return to normal when the underlying medical condition goes away. If they persist, contact your health-care provider. He/she might prescribe antibiotics or other treatments, depending on the diagnosis. Take any medications exactly as prescribed. Keep any follow-up appointments made to check on the condition of your enlarged nodes. SEEK MEDICAL CARE IF:  Swelling lasts for more than two weeks.  You have symptoms such as weight loss, night sweats, fatigue, or fever that does not go away.  The lymph nodes are hard, seem fixed to the skin, or are growing rapidly.  Skin over the lymph nodes is red and inflamed. This could mean there is an infection. SEEK IMMEDIATE MEDICAL CARE IF:  Fluid starts leaking from the area of the enlarged lymph node.  You develop a fever  of 102 F (38.9 C) or greater.  Severe pain develops (not necessarily at the site of a large lymph node).  You develop chest pain or shortness of breath.  You develop worsening abdominal pain. MAKE SURE YOU:  Understand these  instructions.  Will watch your condition.  Will get help right away if you are not doing well or get worse. Document Released: 02/20/2008 Document Revised: 09/27/2013 Document Reviewed: 02/20/2008 [facility] Patient Information 2015 [place], [place]. This information is not intended to replace advice given to you by your health care provider. Make sure you discuss any questions you have with your health care provider.

## 2014-12-11 NOTE — ED Provider Notes (Signed)
CSN: [id number]     Arrival date & time 12/11/14  1819 History   First MD Initiated Contact with Patient 12/11/14 1857     Chief Complaint  Patient presents with  . Abscess     (Consider location/radiation/quality/duration/timing/severity/associated sxs/prior Treatment) Patient is a 19 y.o. female presenting with abscess. The history is provided by the patient. No language interpreter was used.  Abscess Associated symptoms: no fever, no nausea and no vomiting    Tammy Sawyer is a 19 year old female with no past medical history who presents for pain and bilateral groin area for the past 2 weeks. She states that she thinks she may have Scratch fever. She has not taken anything for this. She states the area is tender. Nothing makes her symptoms better or worse. She denies any fever, chills, chest pain, shortness of breath, abdominal pain, nausea, vomiting, diarrhea. She does not have a history of abscesses. History reviewed. No pertinent past medical history. History reviewed. No pertinent past surgical history. History reviewed. No pertinent family history. History  Substance Use Topics  . Smoking status: Never Smoker   . Smokeless tobacco: Not on file  . Alcohol Use: No   OB History    No data available     Review of Systems  Constitutional: Negative for fever and chills.  Respiratory: Negative for shortness of breath.   Cardiovascular: Negative for chest pain.  Gastrointestinal: Negative for nausea, vomiting and abdominal pain.  Genitourinary: Negative for dysuria, urgency, hematuria, vaginal bleeding, vaginal discharge and vaginal pain.  Allergic/Immunologic: Negative for immunocompromised state.  Neurological: Negative for dizziness.      Allergies  Review of patient's allergies indicates no known allergies.  Home Medications   Prior to Admission medications   Medication Sig Start Date End Date Taking? Authorizing Provider  azithromycin (ZITHROMAX) 250 MG tablet Take  1 tablet (250 mg total) by mouth daily. Take first 2 tablets together, then 1 every day until finished. 12/11/14   Avilene Marrin Patel-Mills, PA-C  levofloxacin (LEVAQUIN) 750 MG tablet Take 1 tablet (750 mg total) by mouth daily. 09/22/13   Blane OharaJoshua Zavitz, MD  metroNIDAZOLE (FLAGYL) 500 MG tablet Take 1 tablet (500 mg total) by mouth 2 (two) times daily. 09/19/13   Cherre RobinsBryan Beaver, MD  norgestimate-ethinyl estradiol [facility](MONO-LINYAH) 0.25-35 MG-MCG tablet Take 1 tablet by mouth every evening.    Historical Provider, MD  ondansetron (ZOFRAN ODT) 4 MG disintegrating tablet 4mg  ODT q4 hours prn nausea/vomit 09/22/13   Blane OharaJoshua Zavitz, MD   BP 101/61 mmHg  Pulse 66  Temp(Src) 98 F (36.7 C) (Oral)  Resp 16  SpO2 99%  LMP 11/28/2014 Physical Exam  Constitutional: She is oriented to person, place, and time. She appears well-developed and well-nourished.  HENT:  Head: Normocephalic.  Eyes: Conjunctivae are normal.  Neck: Normal range of motion. Neck supple.  Has no cervical anterior posterior lymphadenopathy. No supraclavicular lymphadenopathy.  Cardiovascular: Normal rate.   Pulmonary/Chest: Effort normal. No respiratory distress.  Abdominal: Soft.    Patient has 2 swollen inguinal lymph nodes on the right and one on the left. They are all tender to palpation and without fluctuance. There is no erythema, ecchymosis or rash in the area.  Musculoskeletal: Normal range of motion.  Ambulatory with steady gait.  Neurological: She is alert and oriented to person, place, and time.  Skin: Skin is warm and dry.  Psychiatric: She has a normal mood and affect. Her behavior is normal.  Nursing note and vitals reviewed.   ED Course  Procedures (including critical care time) Labs Review Labs Reviewed - No data to display  Imaging Review No results found.   EKG Interpretation None      MDM   Final diagnoses:  Lymphadenitis   Patient is well-appearing and in no acute distress. The areas in the groin appear  to be lymphadenitis and not consistent with an abscess. The lymph nodes are palpable and swollen. I am unsure if this is Scratch disease but she has no other clinical manifestations of cat scratch disease including no neurological or cardiac signs. I will put the patient on azithromycin as a precautionary. She can follow-up with her primary care physician. I have also given the patient return precautions. Patient verbally agrees with the plan.    Catha Gosselin, PA-C 12/11/14 2230  Richardean Canal, MD 12/11/14 2330

## 2015-08-10 ENCOUNTER — Emergency Department (HOSPITAL_COMMUNITY)
Admission: EM | Admit: 2015-08-10 | Discharge: 2015-08-10 | Disposition: A | Discharge status: Home / Self Care | Attending: Emergency Medicine | Admitting: Emergency Medicine

## 2015-08-10 ENCOUNTER — Emergency Department (HOSPITAL_COMMUNITY)

## 2015-08-10 ENCOUNTER — Encounter (HOSPITAL_COMMUNITY): Payer: Self-pay | Admitting: Nurse Practitioner

## 2015-08-10 DIAGNOSIS — Z792 Long term (current) use of antibiotics: Secondary | ICD-10-CM | POA: Insufficient documentation

## 2015-08-10 DIAGNOSIS — M79674 Pain in right toe(s): Secondary | ICD-10-CM | POA: Diagnosis present

## 2015-08-10 DIAGNOSIS — Z79899 Other long term (current) drug therapy: Secondary | ICD-10-CM | POA: Insufficient documentation

## 2015-08-10 DIAGNOSIS — L03031 Cellulitis of right toe: Secondary | ICD-10-CM | POA: Diagnosis not present

## 2015-08-10 MED ORDER — NAPROXEN 500 MG PO TABS: 500.0000 mg | ORAL_TABLET | Freq: Two times a day (BID) | ORAL | Status: AC

## 2015-08-10 MED ORDER — CEPHALEXIN 500 MG PO CAPS: 500.0000 mg | ORAL_CAPSULE | Freq: Four times a day (QID) | ORAL | Status: AC

## 2015-08-10 NOTE — ED Provider Notes (Signed)
CSN: [id number]     Arrival date & time 08/10/15  1107 History  By signing my name below, I, Tammy Sawyer, attest that this documentation has been prepared under the direction and in the presence of Kelby Adell, PA-C. Electronically Signed: Evon Slackerrance Sawyer, ED Scribe. 08/10/2015. 12:54 PM.     Chief Complaint  Patient presents with  . Toe Injury    Patient is a 20 y.o. female presenting with toe pain. The history is provided by the patient. No language interpreter was used.  Toe Pain This is a new problem. The current episode started in the past 7 days. The problem occurs constantly. The problem has been unchanged. Associated symptoms include arthralgias and joint swelling. Pertinent negatives include no chills or fever. The symptoms are aggravated by walking (Movement). She has tried ice and rest for the symptoms. The treatment provided no relief.   HPI Comments: Tammy Sawyer is a 20 y.o. female who presents to the Emergency Department complaining of right 2nd toe pain onset 3 days prior. Denies associated injury, bug bite, or recent nail salon visit. Pain is located over the 2nd toe of the right foot. The pain does not radiate into the midfoot. Pt complains of mild associated swelling. She states that the pain is worse with movement and ambulating. She has tried ice and elevation without improvement. Denies any discharge from the nailbed. Denies fever, chills, numbness, tingling or loss of sensation. Denies hx of gout, RA or other chronic joint disease.   History reviewed. No pertinent past medical history. History reviewed. No pertinent past surgical history. History reviewed. No pertinent family history. Social History  Substance Use Topics  . Smoking status: Never Smoker   . Smokeless tobacco: None  . Alcohol Use: Yes   OB History    No data available     Review of Systems  Constitutional: Negative for fever and chills.  Musculoskeletal: Positive for joint swelling and  arthralgias.  All other systems reviewed and are negative.     Allergies  Review of patient's allergies indicates no known allergies.  Home Medications   Prior to Admission medications   Medication Sig Start Date End Date Taking? Authorizing Provider  azithromycin (ZITHROMAX) 250 MG tablet Take 1 tablet (250 mg total) by mouth daily. Take first 2 tablets together, then 1 every day until finished. 12/11/14   Hanna Patel-Mills, PA-C  cephALEXin (KEFLEX) 500 MG capsule Take 1 capsule (500 mg total) by mouth 4 (four) times daily. 08/10/15   Arta Stump, PA-C  levofloxacin (LEVAQUIN) 750 MG tablet Take 1 tablet (750 mg total) by mouth daily. 09/22/13   Blane OharaJoshua Zavitz, MD  metroNIDAZOLE (FLAGYL) 500 MG tablet Take 1 tablet (500 mg total) by mouth 2 (two) times daily. 09/19/13   Cherre RobinsBryan Beaver, MD  naproxen (NAPROSYN) 500 MG tablet Take 1 tablet (500 mg total) by mouth 2 (two) times daily. 08/10/15   Kue Fox, PA-C  norgestimate-ethinyl estradiol (MONO-LINYAH) 0.25-35 MG-MCG tablet Take 1 tablet by mouth every evening.    Historical Provider, MD  ondansetron (ZOFRAN ODT) 4 MG disintegrating tablet 4mg  ODT q4 hours prn nausea/vomit 09/22/13   Blane OharaJoshua Zavitz, MD   BP 122/70 mmHg  Pulse 63  Temp(Src) 98 F (36.7 C) (Oral)  Resp 16  Ht 5\' 8"  (1.727 m)  Wt 70.761 kg  BMI 23.73 kg/m2  SpO2 98%  LMP 07/21/2015   Physical Exam  Constitutional: She is oriented to person, place, and time. She appears well-developed and well-nourished. No distress.  HENT:  Head: Normocephalic and atraumatic.  Eyes: Conjunctivae and EOM are normal.  Neck: Neck supple. No tracheal deviation present.  Cardiovascular: Normal rate and intact distal pulses.   Pedal pulse palpable. Cap refill less than 3 seconds.  Pulmonary/Chest: Effort normal. No respiratory distress.  Musculoskeletal:       Right foot: There is decreased range of motion, tenderness and swelling. There is normal capillary refill and no deformity.        Feet:  Tenderness to palpation over the second toe of the right foot. Mild associated swelling noted. Restricted range of motion secondary to pain. All remaining toes and ankle with full range of motion. Faint erythema noted over the distal, dorsal aspect of the toe. No induration or fluctuance. No vesicles, pustules, or desquamation. No open wound. Patient ambulatory with steady gait  Neurological: She is alert and oriented to person, place, and time.  Sensation intact over the toes.  Skin: Skin is warm and dry.  Psychiatric: She has a normal mood and affect. Her behavior is normal.  Nursing note and vitals reviewed.   ED Course  Procedures (including critical care time) DIAGNOSTIC STUDIES: Oxygen Saturation is 97% on RA, normal by my interpretation.    COORDINATION OF CARE: 1:06 PM-Discussed treatment plan with pt at bedside and pt agreed to plan.   Labs Review Labs Reviewed - No data to display  Imaging Review Dg Toe 2nd Right  08/10/2015  CLINICAL DATA:  Second toe pain for 3 days, no known injury, initial encounter EXAM: RIGHT SECOND TOE COMPARISON:  None. FINDINGS: There is no evidence of fracture or dislocation. There is no evidence of arthropathy or other focal bone abnormality. Soft tissues are unremarkable. IMPRESSION: No acute abnormality noted. Electronically Signed   By: Alcide Clever M.D.   On: 08/10/2015 12:43   I have personally reviewed and evaluated these images as part of my medical decision-making.   EKG Interpretation None      MDM   Final diagnoses:  Cellulitis of toe of right foot   20 year old female presenting with atraumatic right second toe pain 3 days. Afebrile and nontoxic appearing tenderness to palpation over the second toe with associated erythema and swelling. No streaking of the erythema towards the foot. No swelling of the midfoot. No overlying papules, vesicles, induration or fluctuance. No gross abscess for incision and drainage. Foot x-ray  negative. Symptoms consistent with a cellulitis or early paronychia. Area marked and pt encouraged to return if redness begins to streak, extends beyond the markings, fever or nausea/vomiting develop.  Pt is alert, oriented, NAD, afebrile, non tachycardic, nonseptic and nontoxic appearing. Pt to be d/c on Keflex with strict f/u instructions. Patient is to follow-up with her primary care provider if her symptoms do not improve. Return precautions given in discharge paperwork and discussed with pt at bedside. Pt stable for discharge  I personally performed the services described in this documentation, which was scribed in my presence. The recorded information has been reviewed and is accurate.     Alveta Heimlich, PA-C 08/10/15 1426  Arby Barrette, MD 08/11/15 1139

## 2015-08-10 NOTE — ED Notes (Addendum)
She c/o 3 day history of right 2nd toe pain, unknown activity at onset. She has elevated and iced the toe with no relief

## 2015-08-10 NOTE — Discharge Instructions (Signed)
Schedule an appointment with a PCP if your symptoms do not improve. Return to ED with fevers, chills, spreading of the redness of your skin, purulent drainage from your toe, severe worsening of pain or any other new, worsening or concerning symptoms.    Cellulitis Cellulitis is an infection of the skin and the tissue beneath it. The infected area is usually red and tender. Cellulitis occurs most often in the arms and lower legs.  CAUSES  Cellulitis is caused by bacteria that enter the skin through cracks or cuts in the skin. The most common types of bacteria that cause cellulitis are staphylococci and streptococci. SIGNS AND SYMPTOMS   Redness and warmth.  Swelling.  Tenderness or pain.  Fever. DIAGNOSIS  Your health care provider can usually determine what is wrong based on a physical exam. Blood tests may also be done. TREATMENT  Treatment usually involves taking an antibiotic medicine. HOME CARE INSTRUCTIONS   Take your antibiotic medicine as directed by your health care provider. Finish the antibiotic even if you start to feel better.  Keep the infected arm or leg elevated to reduce swelling.  Apply a warm cloth to the affected area up to 4 times per day to relieve pain.  Take medicines only as directed by your health care provider.  Keep all follow-up visits as directed by your health care provider. SEEK MEDICAL CARE IF:   You notice red streaks coming from the infected area.  Your red area gets larger or turns dark in color.  Your bone or joint underneath the infected area becomes painful after the skin has healed.  Your infection returns in the same area or another area.  You notice a swollen bump in the infected area.  You develop new symptoms.  You have a fever. SEEK IMMEDIATE MEDICAL CARE IF:   You feel very sleepy.  You develop vomiting or diarrhea.  You have a general ill feeling (malaise) with muscle aches and pains.   This information is not  intended to replace advice given to you by your health care provider. Make sure you discuss any questions you have with your health care provider.   Document Released: 02/20/2005 Document Revised: 02/01/2015 Document Reviewed: 07/29/2011 Elsevier Interactive Patient Education [employer]2016 Elsevier Inc.

## 2015-08-10 NOTE — ED Notes (Signed)
Declined W/C at D/C and was escorted to lobby by RN. 

## 2015-08-11 ENCOUNTER — Emergency Department (HOSPITAL_COMMUNITY)
Admission: EM | Admit: 2015-08-11 | Discharge: 2015-08-11 | Disposition: A | Discharge status: Home / Self Care | Attending: Emergency Medicine | Admitting: Emergency Medicine

## 2015-08-11 ENCOUNTER — Encounter (HOSPITAL_COMMUNITY): Payer: Self-pay | Admitting: *Deleted

## 2015-08-11 DIAGNOSIS — L03031 Cellulitis of right toe: Secondary | ICD-10-CM | POA: Diagnosis not present

## 2015-08-11 DIAGNOSIS — Z79818 Long term (current) use of other agents affecting estrogen receptors and estrogen levels: Secondary | ICD-10-CM | POA: Diagnosis not present

## 2015-08-11 DIAGNOSIS — Z792 Long term (current) use of antibiotics: Secondary | ICD-10-CM | POA: Insufficient documentation

## 2015-08-11 DIAGNOSIS — M79674 Pain in right toe(s): Secondary | ICD-10-CM | POA: Diagnosis present

## 2015-08-11 MED ORDER — HYDROCODONE-ACETAMINOPHEN 5-325 MG PO TABS: 1.0000 | ORAL_TABLET | Freq: Four times a day (QID) | ORAL | Status: DC | PRN

## 2015-08-11 MED ORDER — HYDROCODONE-ACETAMINOPHEN 5-325 MG PO TABS
1.0000 | ORAL_TABLET | Freq: Once | ORAL | Status: AC
  Administered 2015-08-11: 1 via ORAL
  Filled 2015-08-11: qty 1

## 2015-08-11 MED ORDER — SULFAMETHOXAZOLE-TRIMETHOPRIM 800-160 MG PO TABS
1.0000 | ORAL_TABLET | Freq: Once | ORAL | Status: AC
  Administered 2015-08-11: 1 via ORAL
  Filled 2015-08-11: qty 1

## 2015-08-11 MED ORDER — SULFAMETHOXAZOLE-TRIMETHOPRIM 800-160 MG PO TABS: 1.0000 | ORAL_TABLET | Freq: Two times a day (BID) | ORAL | Status: DC

## 2015-08-11 MED ORDER — KETOROLAC TROMETHAMINE 30 MG/ML IJ SOLN
30.0000 mg | Freq: Once | INTRAMUSCULAR | Status: AC
  Administered 2015-08-11: 30 mg via INTRAMUSCULAR
  Filled 2015-08-11: qty 1

## 2015-08-11 NOTE — ED Notes (Signed)
Pt reports being seen yesterday for infection to right second toe. Was given antibiotics and pain meds and taking as prescribed but no relief from pain and swelling. Pt unsure if redness or swelling has spread. Ambulatory at triage and no acute distress noted.

## 2015-08-11 NOTE — ED Notes (Signed)
Pt left with all her belongings and ambulated out of the treatment area.  

## 2015-08-11 NOTE — ED Provider Notes (Signed)
CSN: [id number]     Arrival date & time 08/11/15  1500 History   First MD Initiated Contact with Patient 08/11/15 1843     Chief Complaint  Patient presents with  . Foot Pain  . Cellulitis     (Consider location/radiation/quality/duration/timing/severity/associated sxs/prior Treatment) Patient is a 20 y.o. female presenting with lower extremity pain. The history is provided by the patient.  Foot Pain This is a new problem. The current episode started in the past 7 days. The problem occurs constantly. The problem has been gradually worsening. Associated symptoms include arthralgias (just right 2nd toe) and a rash. Pertinent negatives include no abdominal pain, chest pain, chills, coughing, diaphoresis, fatigue, fever, headaches, myalgias, nausea, numbness, sore throat, vomiting or weakness. Exacerbated by: direct palpation. She has tried NSAIDs for the symptoms. The treatment provided no relief.    History reviewed. No pertinent past medical history. History reviewed. No pertinent past surgical history. History reviewed. No pertinent family history. Social History  Substance Use Topics  . Smoking status: Never Smoker   . Smokeless tobacco: None  . Alcohol Use: Yes   OB History    No data available     Review of Systems  Constitutional: Negative for fever, chills, diaphoresis, activity change, appetite change and fatigue.  HENT: Negative for facial swelling, rhinorrhea, sore throat, trouble swallowing and voice change.   Eyes: Negative for photophobia, pain and visual disturbance.  Respiratory: Negative for cough, shortness of breath, wheezing and stridor.   Cardiovascular: Negative for chest pain, palpitations and leg swelling.  Gastrointestinal: Negative for nausea, vomiting, abdominal pain, constipation and anal bleeding.  Endocrine: Negative.   Genitourinary: Negative for dysuria, vaginal bleeding, vaginal discharge and vaginal pain.  Musculoskeletal: Positive for arthralgias  (just right 2nd toe). Negative for myalgias and back pain.  Skin: Positive for rash.  Allergic/Immunologic: Negative.   Neurological: Negative for dizziness, tremors, syncope, weakness, numbness and headaches.  Psychiatric/Behavioral: Negative for suicidal ideas, sleep disturbance and self-injury.  All other systems reviewed and are negative.     Allergies  Review of patient's allergies indicates no known allergies.  Home Medications   Prior to Admission medications   Medication Sig Start Date End Date Taking? Authorizing Provider  azithromycin (ZITHROMAX) 250 MG tablet Take 1 tablet (250 mg total) by mouth daily. Take first 2 tablets together, then 1 every day until finished. 12/11/14   Hanna Patel-Mills, PA-C  cephALEXin (KEFLEX) 500 MG capsule Take 1 capsule (500 mg total) by mouth 4 (four) times daily. 08/10/15   Stevi Barrett, PA-C  HYDROcodone-acetaminophen (NORCO/VICODIN) 5-325 MG tablet Take 1 tablet by mouth every 6 (six) hours as needed for severe pain. 08/11/15   Lula Olszewski, MD  levofloxacin (LEVAQUIN) 750 MG tablet Take 1 tablet (750 mg total) by mouth daily. 09/22/13   Blane Ohara, MD  metroNIDAZOLE (FLAGYL) 500 MG tablet Take 1 tablet (500 mg total) by mouth 2 (two) times daily. 09/19/13   Cherre Robins, MD  naproxen (NAPROSYN) 500 MG tablet Take 1 tablet (500 mg total) by mouth 2 (two) times daily. 08/10/15   Stevi Barrett, PA-C  norgestimate-ethinyl estradiol (MONO-LINYAH) 0.25-35 MG-MCG tablet Take 1 tablet by mouth every evening.    Historical Provider, MD  ondansetron (ZOFRAN ODT) 4 MG disintegrating tablet  ODT q4 hours prn nausea/vomit 09/22/13   Blane Ohara, MD  sulfamethoxazole-trimethoprim (BACTRIM DS,SEPTRA DS) 800-160 MG tablet Take 1 tablet by mouth 2 (two) times daily. 08/12/15 08/18/15  Lula Olszewski, MD   BP 125/78 mmHg  Pulse 60  Temp(Src) 98 F (36.7 C) (Oral)  Resp 18  SpO2 100%  LMP 07/21/2015 Physical Exam  Constitutional: She is oriented to person,  place, and time. She appears well-developed and well-nourished. No distress.  HENT:  Head: Normocephalic and atraumatic.  Right Ear: External ear normal.  Left Ear: External ear normal.  Mouth/Throat: Oropharynx is clear and moist. No oropharyngeal exudate.  Eyes: Conjunctivae and EOM are normal. Pupils are equal, round, and reactive to light. No scleral icterus.  Neck: Normal range of motion. Neck supple. No JVD present. No tracheal deviation present. No thyromegaly present.  Cardiovascular: Normal rate, regular rhythm and intact distal pulses.  Exam reveals no gallop and no friction rub.   No murmur heard. Pulmonary/Chest: Effort normal and breath sounds normal. No respiratory distress. She has no wheezes. She has no rales.  Abdominal: Soft. Bowel sounds are normal. She exhibits no distension. There is no tenderness.  Musculoskeletal: Normal range of motion. She exhibits tenderness (to right 2nd toe. Full ROM (although with pain)). She exhibits no edema.  Neurological: She is alert and oriented to person, place, and time. No cranial nerve deficit. She exhibits normal muscle tone. Coordination normal.  Skin: Skin is warm and dry. She is not diaphoretic. There is erythema (redness and induration to right 2nd toe. Not fusiform. No red streaking, No fluctuance. ). No pallor.  Psychiatric: She has a normal mood and affect. She expresses no homicidal and no suicidal ideation. She expresses no suicidal plans and no homicidal plans.  Nursing note and vitals reviewed.   ED Course  Procedures (including critical care time) Labs Review Labs Reviewed - No data to display  Imaging Review Dg Toe 2nd Right  08/10/2015  CLINICAL DATA:  Second toe pain for 3 days, no known injury, initial encounter EXAM: RIGHT SECOND TOE COMPARISON:  None. FINDINGS: There is no evidence of fracture or dislocation. There is no evidence of arthropathy or other focal bone abnormality. Soft tissues are unremarkable.  IMPRESSION: No acute abnormality noted. Electronically Signed   By: Alcide CleverMark  Lukens M.D.   On: 08/10/2015 12:43   I have personally reviewed and evaluated these images and lab results as part of my medical decision-making.   EKG Interpretation None      MDM   Final diagnoses:  Cellulitis of toe of right foot    The patient is a 20 year old female with no significant past medical history who presents for the second time in 2 days for pain and erythema to the right second toe. Patient had negative x-rays during her visit yesterday. She is tightness with cellulitis and discharged with naproxen and Keflex. Patient presents today for worsening pain. Redness and erythema does not seem to have spread from yesterday's level. Patient is bearing weight fine. Do not suspect sepsis or systemic infection based on vitals history and exam. Do not suspect septic joint as patient is afebrile and has full range of motion of joints. Suspect patient has cellulitis. We'll add Bactrim to current Keflex regimen. Patient given additional pain control. She is given strict return precautions for spreading of redness or systemic symptoms including fever. Patient expresses understanding and agreement with this plan.  Patient seen with attending, Dr. Ranae PalmsYelverton, who oversaw clinical decision making.   Lula OlszewskiMike Zema Lizardo, MD 08/12/15 [id number]  Loren Raceravid Yelverton, MD 08/16/15 [phone]

## 2015-08-11 NOTE — Discharge Instructions (Signed)
Cellulitis °Cellulitis is an infection of the skin and the tissue under the skin. The infected area is usually red and tender. This happens most often in the arms and lower legs. °HOME CARE  °· Take your antibiotic medicine as told. Finish the medicine even if you start to feel better. °· Keep the infected arm or leg raised (elevated). °· Put a warm cloth on the area up to 4 times per day. °· Only take medicines as told by your doctor. °· Keep all doctor visits as told. °GET HELP IF: °· You see red streaks on the skin coming from the infected area. °· Your red area gets bigger or turns a dark color. °· Your bone or joint under the infected area is painful after the skin heals. °· Your infection comes back in the same area or different area. °· You have a puffy (swollen) bump in the infected area. °· You have new symptoms. °· You have a fever. °GET HELP RIGHT AWAY IF:  °· You feel very sleepy. °· You throw up (vomit) or have watery poop (diarrhea). °· You feel sick and have muscle aches and pains. °  °This information is not intended to replace advice given to you by your health care provider. Make sure you discuss any questions you have with your health care provider. °  °Document Released: 10/30/2007 Document Revised: 02/01/2015 Document Reviewed: 07/29/2011 °Elsevier Interactive Patient Education ©2016 Elsevier Inc. ° °

## 2015-08-13 ENCOUNTER — Encounter (HOSPITAL_COMMUNITY): Payer: Self-pay | Admitting: Emergency Medicine

## 2015-08-13 ENCOUNTER — Emergency Department (HOSPITAL_COMMUNITY)
Admission: EM | Admit: 2015-08-13 | Discharge: 2015-08-13 | Disposition: A | Discharge status: Home / Self Care | Attending: Emergency Medicine | Admitting: Emergency Medicine

## 2015-08-13 DIAGNOSIS — M7981 Nontraumatic hematoma of soft tissue: Secondary | ICD-10-CM | POA: Diagnosis not present

## 2015-08-13 DIAGNOSIS — M7989 Other specified soft tissue disorders: Secondary | ICD-10-CM | POA: Insufficient documentation

## 2015-08-13 DIAGNOSIS — Z792 Long term (current) use of antibiotics: Secondary | ICD-10-CM | POA: Insufficient documentation

## 2015-08-13 DIAGNOSIS — M79671 Pain in right foot: Secondary | ICD-10-CM | POA: Diagnosis not present

## 2015-08-13 LAB — BASIC METABOLIC PANEL WITH GFR
Anion gap: 8 (ref 5–15)
BUN: 10 mg/dL (ref 6–20)
CO2: 25 mmol/L (ref 22–32)
Calcium: 9.5 mg/dL (ref 8.9–10.3)
Chloride: 105 mmol/L (ref 101–111)
Creatinine, Ser: 1.08 mg/dL — ABNORMAL HIGH (ref 0.44–1.00)
GFR calc Af Amer: >60 mL/min
GFR calc non Af Amer: >60 mL/min
Glucose, Bld: 87 mg/dL (ref 65–99)
Potassium: 4.8 mmol/L (ref 3.5–5.1)
Sodium: 138 mmol/L (ref 135–145)

## 2015-08-13 LAB — CBC
HCT: 36.7 % (ref 36.0–46.0)
Hemoglobin: 12 g/dL (ref 12.0–15.0)
MCH: 28.3 pg (ref 26.0–34.0)
MCHC: 32.7 g/dL (ref 30.0–36.0)
MCV: 86.6 fL (ref 78.0–100.0)
Platelets: 285 10*3/uL (ref 150–400)
RBC: 4.24 MIL/uL (ref 3.87–5.11)
RDW: 13.2 % (ref 11.5–15.5)
WBC: 6.2 10*3/uL (ref 4.0–10.5)

## 2015-08-13 LAB — URIC ACID: Uric Acid, Serum: 4.2 mg/dL (ref 2.3–6.6)

## 2015-08-13 MED ORDER — RABIES IMMUNE GLOBULIN 150 UNIT/ML IM INJ
20.0000 [IU]/kg | INJECTION | Freq: Once | INTRAMUSCULAR | Status: DC
  Filled 2015-08-13: qty 9.5

## 2015-08-13 MED ORDER — HYDROCODONE-ACETAMINOPHEN 5-325 MG PO TABS: 1.0000 | ORAL_TABLET | Freq: Four times a day (QID) | ORAL | Status: DC | PRN

## 2015-08-13 MED ORDER — IBUPROFEN 400 MG PO TABS
400.0000 mg | ORAL_TABLET | Freq: Once | ORAL | Status: AC
  Administered 2015-08-13: 400 mg via ORAL
  Filled 2015-08-13: qty 1

## 2015-08-13 MED ORDER — RABIES VACCINE, PCEC IM SUSR
1.0000 mL | Freq: Once | INTRAMUSCULAR | Status: DC
  Filled 2015-08-13: qty 1

## 2015-08-13 MED ORDER — INDOMETHACIN 25 MG PO CAPS: 25.0000 mg | ORAL_CAPSULE | Freq: Three times a day (TID) | ORAL | Status: AC | PRN

## 2015-08-13 NOTE — Discharge Instructions (Signed)
You will need to follow up with your primary care doctor for your pain and inflammation of your toe. We have done a uric acid tonight but it will not be back tonight.

## 2015-08-13 NOTE — ED Provider Notes (Signed)
CSN: [id number]     Arrival date & time 08/13/15  1912 History   First MD Initiated Contact with Patient 08/13/15 1927     Chief Complaint  Patient presents with  . Foot Pain     (Consider location/radiation/quality/duration/timing/severity/associated sxs/prior Treatment) HPI Tammy Sawyer is a 20 y.o. female who presents to the ED with right foot pain and swelling. The patient was evaluated here 2 days ago for same and given Keflex and told to return for worsening symptoms. Patient returned yesterday and was given Bactrim in addition to the Keflex. To day she reports that the redness and pain is worse.   History reviewed. No pertinent past medical history. History reviewed. No pertinent past surgical history. No family history on file. Social History  Substance Use Topics  . Smoking status: Never Smoker   . Smokeless tobacco: None  . Alcohol Use: Yes   OB History    No data available     Review of Systems Negative except as stated in HPI   Allergies  Review of patient's allergies indicates no known allergies.  Home Medications   Prior to Admission medications   Medication Sig Start Date End Date Taking? Authorizing Provider  cephALEXin (KEFLEX) 500 MG capsule Take 1 capsule (500 mg total) by mouth 4 (four) times daily. 08/10/15  Yes Stevi Barrett, PA-C  naproxen (NAPROSYN) 500 MG tablet Take 1 tablet (500 mg total) by mouth 2 (two) times daily. Patient taking differently: Take 500 mg by mouth 2 (two) times daily as needed for mild pain.  08/10/15  Yes Stevi Barrett, PA-C  naproxen sodium (ANAPROX) 220 MG tablet Take 220 mg by mouth 2 (two) times daily as needed (pain).   Yes Historical Provider, MD  ondansetron (ZOFRAN ODT) 4 MG disintegrating tablet  ODT q4 hours prn nausea/vomit 09/22/13  Yes Blane Ohara, MD  HYDROcodone-acetaminophen (NORCO) 5-325 MG tablet Take 1 tablet by mouth every 6 (six) hours as needed. 08/13/15   Eiko Mcgowen Orlene Och, NP  indomethacin (INDOCIN) 25 MG  capsule Take 1 capsule (25 mg total) by mouth 3 (three) times daily as needed. 08/13/15   Chailyn Racette Orlene Och, NP   BP 107/63 mmHg  Pulse 67  Temp(Src) 97.9 F (36.6 C) (Oral)  Resp 18  Ht  (1.727 m)  Wt 72.32 kg  BMI 24.25 kg/m2  SpO2 100%  LMP 08/13/2015 (Exact Date) Physical Exam  Constitutional: She is oriented to person, place, and time. She appears well-developed and well-nourished.  HENT:  Head: Normocephalic and atraumatic.  Eyes: EOM are normal.  Neck: Neck supple.  Cardiovascular: Normal rate.   Pulmonary/Chest: Effort normal.  Musculoskeletal:       Right foot: There is tenderness and swelling.       Feet:  There is ecchymosis noted to the dorsum of the right second toe with mild erythema that extends to the dorsum of the foot. There is also mild erythema noted to the plantar aspect of the second toe. Pedal pulse 2+, adequate circulation. Slightly increased warmth.  Neurological: She is alert and oriented to person, place, and time. No cranial nerve deficit.  Skin: Skin is warm and dry.  Psychiatric: She has a normal mood and affect. Her behavior is normal.  Nursing note and vitals reviewed.   ED Course  Procedures (including critical care time) Patient's grandparents are here with the patient. They drove from [place] to bring her back to the ED to find out why her symptoms are getting worse.  2115 Dr. Deretha EmoryZackowski in to examine the patient will order labs and treat for pain and inflammation.  Labs Review Results for orders placed or performed during the hospital encounter of 08/13/15 (from the past 24 hour(s))  CBC     Status: None   Collection Time: 08/13/15  9:40 PM  Result Value Ref Range   WBC 6.2 4.0 - 10.5 K/uL   RBC 4.24 3.87 - 5.11 MIL/uL   Hemoglobin 12.0 12.0 - 15.0 g/dL   HCT 40.936.7 81.136.0 - 91.446.0 %   MCV 86.6 78.0 - 100.0 fL   MCH 28.3 26.0 - 34.0 pg   MCHC 32.7 30.0 - 36.0 g/dL   RDW 78.213.2 95.611.5 - 21.315.5 %   Platelets 285 150 - 400 K/uL  Basic  metabolic panel     Status: Abnormal   Collection Time: 08/13/15  9:40 PM  Result Value Ref Range   Sodium 138 135 - 145 mmol/L   Potassium 4.8 3.5 - 5.1 mmol/L   Chloride 105 101 - 111 mmol/L   CO2 25 22 - 32 mmol/L   Glucose, Bld 87 65 - 99 mg/dL   BUN 10 6 - 20 mg/dL   Creatinine, Ser 0.861.08 (H) 0.44 - 1.00 mg/dL   Calcium 9.5 8.9 - 57.810.3 mg/dL   GFR calc non Af Amer >60 >60 mL/min   GFR calc Af Amer >60 >60 mL/min   Anion gap 8 5 - 15  Uric acid     Status: None   Collection Time: 08/13/15  9:40 PM  Result Value Ref Range   Uric Acid, Serum 4.2 2.3 - 6.6 mg/dL     Imaging Review No results found. I have personally reviewed the lab results as part of my medical decision-making.   MDM  20 y.o. female with pain and swelling of the second toe of the right foot stable for d/c without fever and normal labs. Will treat with indocin and hydrocodone and she will follow up with her PCP for further evaluation. Post op shoe and crutches. Patient to continue Bactrim DS.  Final diagnoses:  Right foot pain       Janne NapoleonHope M Allix Blomquist, NP 08/13/15 2237  Vanetta MuldersScott Zackowski, MD 08/14/15 [phone]

## 2015-08-13 NOTE — ED Notes (Signed)
Pt c/o right foot pain and swelling. Pt reports started just with one of her toes on Monday. Pt was seen here for same 2 days ago, reports swelling has spread.

## 2015-08-13 NOTE — ED Notes (Signed)
See EDP assessment 

## 2015-08-13 NOTE — ED Notes (Signed)
Went to draw blood, pt mother began telling this RN where to stick patient at and how to draw blood. Attempted to stick pt in R hand, explained to pt before that this is a sensitive area. Stuck pt, pt began squirming and mother began freaking out on this RN stating that I needed to stop and take the needle out. Politely informed the pt mother to please let me do my job. Pt mother began yelling at this nurse again, once again told the mother to please let me do my job or I would have her escorted to the lobby to wait. Called lab and asked them to please collect blood and excused myself from the room.

## 2015-08-13 NOTE — Progress Notes (Signed)
Orthopedic Tech Progress Note Patient Details:  Tammy Sawyer 1995-12-08 [id number]  Ortho Devices Type of Ortho Device: Crutches Ortho Device/Splint Interventions: Ordered, Adjustment   Tammy Sawyer, Tammy Sawyer 08/13/2015, 10:42 PM

## 2015-12-21 ENCOUNTER — Ambulatory Visit (HOSPITAL_COMMUNITY)
Admission: EM | Admit: 2015-12-21 | Discharge: 2015-12-21 | Disposition: A | Discharge status: Home / Self Care | Attending: Family Medicine | Admitting: Family Medicine

## 2015-12-21 ENCOUNTER — Encounter (HOSPITAL_COMMUNITY): Payer: Self-pay | Admitting: Family Medicine

## 2015-12-21 DIAGNOSIS — M940 Chondrocostal junction syndrome [Tietze]: Secondary | ICD-10-CM | POA: Diagnosis not present

## 2015-12-21 MED ORDER — MELOXICAM 15 MG PO TABS: 7.5000 mg | ORAL_TABLET | Freq: Every day | ORAL | 0 refills | Status: AC

## 2015-12-21 NOTE — ED Provider Notes (Signed)
[facility]    CSN: [id number] Arrival date & time: 12/21/15  1003  First Provider Contact:  None       History   Chief Complaint Chief Complaint  Patient presents with  . Chest Pain    HPI Tammy Sawyer is a 20 y.o. female.   HPI  Chest pain: started June25th while at family reunion. Feels over heart w/ radiation to L arm. "compressing": pain. Sporadic. Comes on at random times.  Came on after pushups.  Pain lasts hours typically  Movement and deep breathing  make the pain worse.  No change w/ exertion Denies palpitations, sob, diaphoresis nausea, syncope  On birth control only right now  Past medical and surgical histories are noncontributory. She does not smoke or drink. There is no family history of coronary artery disease/heart attack, breast cancer.  History reviewed. No pertinent past medical history.  There are no active problems to display for this patient.   History reviewed. No pertinent surgical history.  OB History    No data available       Home Medications    Prior to Admission medications   Medication Sig Start Date End Date Taking? Authorizing Provider  cephALEXin (KEFLEX) 500 MG capsule Take 1 capsule (500 mg total) by mouth 4 (four) times daily. 08/10/15   Stevi Barrett, PA-C  HYDROcodone-acetaminophen (NORCO) 5-325 MG tablet Take 1 tablet by mouth every 6 (six) hours as needed. 08/13/15   Hope Orlene Och, NP  indomethacin (INDOCIN) 25 MG capsule Take 1 capsule (25 mg total) by mouth 3 (three) times daily as needed. 08/13/15   Hope Orlene Och, NP  meloxicam (MOBIC) 15 MG tablet Take 0.5-1 tablets (7.5-15 mg total) by mouth daily. 12/21/15   Ozella Rocks, MD  naproxen (NAPROSYN) 500 MG tablet Take 1 tablet (500 mg total) by mouth 2 (two) times daily. Patient taking differently: Take 500 mg by mouth 2 (two) times daily as needed for mild pain.  08/10/15   Stevi Barrett, PA-C  naproxen sodium (ANAPROX) 220 MG tablet Take 220 mg by mouth 2  (two) times daily as needed (pain).    Historical Provider, MD  ondansetron (ZOFRAN ODT) 4 MG disintegrating tablet 4mg  ODT q4 hours prn nausea/vomit 09/22/13   Blane Ohara, MD    Family History Family History  Problem Relation Age of Onset  . Diabetes Other     Social History Social History  Substance Use Topics  . Smoking status: Never Smoker  . Smokeless tobacco: Never Used  . Alcohol use Yes     Allergies   Review of patient's allergies indicates no known allergies.   Review of Systems Review of Systems Per HPI with all other pertinent systems negative.    Physical Exam Triage Vital Signs ED Triage Vitals  Enc Vitals Group     BP      Pulse      Resp      Temp      Temp src      SpO2      Weight      Height      Head Circumference      Peak Flow      Pain Score      Pain Loc      Pain Edu?      Excl. in GC?    No data found.   Updated Vital Signs BP 124/74 (BP Location: Right Arm)   Pulse 82   Temp 98  F (36.7 C) (Core (Comment))   LMP 12/14/2015   SpO2 98%   Visual Acuity Right Eye Distance:   Left Eye Distance:   Bilateral Distance:    Right Eye Near:   Left Eye Near:    Bilateral Near:     Physical Exam Physical Exam  Constitutional: oriented to person, place, and time. appears well-developed and well-nourished. No distress.  HENT:  Head: Normocephalic and atraumatic.  Eyes: EOMI. PERRL.  Neck: Normal range of motion.  Cardiovascular: RRR, no m/r/g, 2+ distal pulses,  Pulmonary/Chest: Effort normal and breath sounds normal. No respiratory distress.  Abdominal: Soft. Bowel sounds are normal. NonTTP, no distension.  Musculoskeletal: Chest pain is reproducible on palpation of the left upper chest wall. Normal range of motion. Non ttp, no effusion.  Neurological: alert and oriented to person, place, and time.  Skin: Skin is warm. No rash noted. non diaphoretic.  Psychiatric: normal mood and affect. behavior is normal. Judgment and  thought content normal.    UC Treatments / Results  Labs (all labs ordered are listed, but only abnormal results are displayed) Labs Reviewed - No data to display  EKG  EKG Interpretation None       Radiology No results found.  Procedures Procedures (including critical care time)  Medications Ordered in UC Medications - No data to display   Initial Impression / Assessment and Plan / UC Course  I have reviewed the triage vital signs and the nursing notes.  Pertinent labs & imaging results that were available during my care of the patient were reviewed by me and considered in my medical decision making (see chart for details).  Clinical Course  Chest pain is musculoskeletal with particular involvement of the left costochondral border. Likely exacerbated with recent exercises/pushups. Meloxicam. Patient with intermittent upset stomach with her birth control pill so suspect she may develop some reflux type pain on the meloxicam. Counseling patient to start Zantac or Prilosec in conjunction with her meloxicam.  She given strict return precautions for which she should go to the emergency room immediately. All answered  Final Clinical Impressions(s) / UC Diagnoses   Final diagnoses:  Costochondritis    New Prescriptions New Prescriptions   MELOXICAM (MOBIC) 15 MG TABLET    Take 0.5-1 tablets (7.5-15 mg total) by mouth daily.     Ozella Rocks, MD 12/21/15 [phone]

## 2015-12-21 NOTE — Discharge Instructions (Signed)
Your chest pain is likely from chest wall irritation. Please start the meloxicam for the pain and use zantac or prilosec for stomach irritation caused by the meloxicam. Please follow up with a heart doctor or in the emergency room if your pain continues.

## 2015-12-21 NOTE — ED Triage Notes (Signed)
She c/o 1 month history of intermittent chest pain, relieved by rest, no associated symptoms. Onset after she did push ups 1 month ago. Dr. Konrad Dolores is at the bedside with pt now.

## 2016-08-10 ENCOUNTER — Emergency Department (HOSPITAL_COMMUNITY)
Admission: EM | Admit: 2016-08-10 | Discharge: 2016-08-10 | Disposition: A | Discharge status: Home / Self Care | Attending: Emergency Medicine | Admitting: Emergency Medicine

## 2016-08-10 ENCOUNTER — Encounter (HOSPITAL_COMMUNITY): Payer: Self-pay

## 2016-08-10 ENCOUNTER — Emergency Department (HOSPITAL_COMMUNITY)

## 2016-08-10 DIAGNOSIS — Z79899 Other long term (current) drug therapy: Secondary | ICD-10-CM | POA: Insufficient documentation

## 2016-08-10 DIAGNOSIS — S52502A Unspecified fracture of the lower end of left radius, initial encounter for closed fracture: Secondary | ICD-10-CM | POA: Diagnosis not present

## 2016-08-10 DIAGNOSIS — Y939 Activity, unspecified: Secondary | ICD-10-CM | POA: Diagnosis not present

## 2016-08-10 DIAGNOSIS — S52612A Displaced fracture of left ulna styloid process, initial encounter for closed fracture: Secondary | ICD-10-CM | POA: Insufficient documentation

## 2016-08-10 DIAGNOSIS — Y999 Unspecified external cause status: Secondary | ICD-10-CM | POA: Diagnosis not present

## 2016-08-10 DIAGNOSIS — S59912A Unspecified injury of left forearm, initial encounter: Secondary | ICD-10-CM | POA: Diagnosis present

## 2016-08-10 DIAGNOSIS — Y92410 Unspecified street and highway as the place of occurrence of the external cause: Secondary | ICD-10-CM | POA: Diagnosis not present

## 2016-08-10 MED ORDER — HYDROCODONE-ACETAMINOPHEN 5-325 MG PO TABS: 1.0000 | ORAL_TABLET | ORAL | 0 refills | Status: AC | PRN

## 2016-08-10 MED ORDER — MORPHINE SULFATE (PF) 4 MG/ML IV SOLN
4.0000 mg | Freq: Once | INTRAVENOUS | Status: AC
  Administered 2016-08-10: 4 mg via INTRAVENOUS
  Filled 2016-08-10: qty 1

## 2016-08-10 MED ORDER — CYCLOBENZAPRINE HCL 10 MG PO TABS: 10.0000 mg | ORAL_TABLET | Freq: Two times a day (BID) | ORAL | 0 refills | Status: AC | PRN
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
## 2016-08-10 NOTE — ED Notes (Signed)
Ortho called for sugar tong

## 2016-08-10 NOTE — Progress Notes (Signed)
Orthopedic Tech Progress Note Patient Details:  Tammy Sawyer 29-Mar-1996 [id number]  Ortho Devices Type of Ortho Device: Sugartong splint Ortho Device/Splint Location: lue Ortho Device/Splint Interventions: Ordered, Application   [name], [name] 08/10/2016, 6:52 AM
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
## 2016-08-10 NOTE — ED Notes (Signed)
Ortho at bedside for splint placement

## 2017-01-15 ENCOUNTER — Ambulatory Visit (HOSPITAL_COMMUNITY)
Admission: EM | Admit: 2017-01-15 | Discharge: 2017-01-15 | Disposition: A | Discharge status: Home / Self Care | Attending: Family Medicine | Admitting: Family Medicine

## 2017-01-15 ENCOUNTER — Encounter (HOSPITAL_COMMUNITY): Payer: Self-pay | Admitting: Emergency Medicine

## 2017-01-15 DIAGNOSIS — G44219 Episodic tension-type headache, not intractable: Secondary | ICD-10-CM

## 2017-01-15 DIAGNOSIS — K0889 Other specified disorders of teeth and supporting structures: Secondary | ICD-10-CM

## 2017-01-15 NOTE — Discharge Instructions (Signed)
I would recommend that you follow up with an ophthalmologist or optometrist to get your eyes checked.  Not sure if there is a postconcussive nature to these headaches , however it is possible. I would continue to use Tylenol and ibuprofen when the headaches come on. Make sure that your cognizant of your sleep and the signature eating. Sometimes headaches are worsened by caffeine or chocolate. Consider making a headache diary to see if there is anything that specifically causes this onset of headache. If these don't improve over the next couple months or if you continue to be concerned you can always follow-up with a neurologist.
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
## 2017-02-20 ENCOUNTER — Encounter: Payer: Self-pay | Admitting: Psychiatry

## 2017-02-20 ENCOUNTER — Ambulatory Visit (INDEPENDENT_AMBULATORY_CARE_PROVIDER_SITE_OTHER): Payer: Self-pay | Admitting: Psychiatry

## 2017-02-20 VITALS — BP 110/75 | HR 83 | Temp 98.4°F | Resp 17 | Wt 156.8 lb

## 2017-02-20 DIAGNOSIS — Z025 Encounter for examination for participation in sport: Secondary | ICD-10-CM

## 2017-02-20 NOTE — Progress Notes (Signed)
Physical Exam    Review of Systems  All other systems reviewed and are negative.     Tammy Sawyer is a 21 y.o. female who is here for a sports physical. To play teach sports in school.   No family history of sickle cell disease. No family history of sudden cardiac death. No current medical concerns or physical ailment.  No history of concussion. none  PHYSICAL EXAM:  Vital signs noted. HEENT: Within normal limits Neck: Within normal limits Lungs: Clear Heart: Regular rate and rhythm without murmur. Within normal limits. Abdomen: Negative Musculoskeletal and spine exam: Within normal limits. Skin: Within normal limits  Assessment: Normal sports physical  Plan: Anticipatory guidance discussed with patient and parent(s).  Physical Exam  Constitutional: She is oriented to person, place, and time and well-developed, well-nourished, and in no distress.  HENT:  Head: Normocephalic and atraumatic.  Eyes: Pupils are equal, round, and reactive to light.  Neck: Normal range of motion.  Cardiovascular: Normal rate and regular rhythm.   Pulmonary/Chest: Effort normal and breath sounds normal.  Abdominal: Soft. Bowel sounds are normal.  Genitourinary:  Genitourinary Comments: Deferred  Musculoskeletal: Normal range of motion.  Neurological: She is alert and oriented to person, place, and time. Gait normal.  Skin: Skin is warm.  Psychiatric: Affect and judgment normal.    Review of Systems  All other systems reviewed and are negative.            Form completed, to be scanned into EMR chart.          Followup with PCP for ongoing preventive care and immunizations.          Please see the sports form for any further details.
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
## 2017-08-26 ENCOUNTER — Ambulatory Visit (INDEPENDENT_AMBULATORY_CARE_PROVIDER_SITE_OTHER): Payer: Self-pay

## 2017-08-26 ENCOUNTER — Encounter (INDEPENDENT_AMBULATORY_CARE_PROVIDER_SITE_OTHER): Payer: Self-pay | Admitting: Orthopaedic Surgery

## 2017-08-26 ENCOUNTER — Ambulatory Visit (INDEPENDENT_AMBULATORY_CARE_PROVIDER_SITE_OTHER): Payer: Self-pay | Admitting: Orthopaedic Surgery

## 2017-08-26 DIAGNOSIS — M67432 Ganglion, left wrist: Secondary | ICD-10-CM

## 2017-08-26 DIAGNOSIS — M25532 Pain in left wrist: Secondary | ICD-10-CM | POA: Insufficient documentation

## 2017-08-26 MED ORDER — BUPIVACAINE HCL 0.5 % IJ SOLN
0.3300 mL | INTRAMUSCULAR | Status: AC | PRN
  Administered 2017-08-26: .33 mL

## 2017-08-26 MED ORDER — METHYLPREDNISOLONE ACETATE 40 MG/ML IJ SUSP
13.3300 mg | INTRAMUSCULAR | Status: AC | PRN
  Administered 2017-08-26: 13.33 mg

## 2017-08-26 MED ORDER — LIDOCAINE HCL 1 % IJ SOLN
0.3000 mL | INTRAMUSCULAR | Status: AC | PRN
  Administered 2017-08-26: .3 mL
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
